# Patient Record
Sex: Male | Born: 1990 | Race: Black or African American | Hispanic: No | Marital: Single | Smoking: Current some day smoker
Health system: Southern US, Community
[De-identification: ages and names within clinical notes are randomized; demographics above are authoritative.]

---

## 2013-12-16 ENCOUNTER — Ambulatory Visit: Payer: Self-pay | Admitting: Family Medicine

## 2013-12-17 ENCOUNTER — Ambulatory Visit: Payer: Self-pay | Admitting: Family Medicine

## 2013-12-23 ENCOUNTER — Ambulatory Visit (INDEPENDENT_AMBULATORY_CARE_PROVIDER_SITE_OTHER): Payer: Self-pay | Admitting: Family Medicine

## 2013-12-23 ENCOUNTER — Encounter: Payer: Self-pay | Admitting: Family Medicine

## 2013-12-23 VITALS — BP 108/68 | HR 72 | Temp 98.6°F | Ht 72.0 in | Wt 197.4 lb

## 2013-12-23 DIAGNOSIS — IMO0002 Reserved for concepts with insufficient information to code with codable children: Secondary | ICD-10-CM

## 2013-12-23 DIAGNOSIS — R2232 Localized swelling, mass and lump, left upper limb: Secondary | ICD-10-CM

## 2013-12-23 DIAGNOSIS — R229 Localized swelling, mass and lump, unspecified: Secondary | ICD-10-CM

## 2013-12-23 DIAGNOSIS — T148XXA Other injury of unspecified body region, initial encounter: Secondary | ICD-10-CM

## 2013-12-23 DIAGNOSIS — Z23 Encounter for immunization: Secondary | ICD-10-CM

## 2013-12-23 NOTE — Progress Notes (Signed)
Pre visit review using our clinic review tool, if applicable. No additional management support is needed unless otherwise documented below in the visit note. 

## 2013-12-23 NOTE — Progress Notes (Signed)
Patient ID: Eric Yoder, male   DOB: October 18, 1991, 23 y.o.   MRN: [id number]   Subjective:    Patient ID: Eric Yoder, male    DOB: October 18, 1991, 23 y.o.   MRN: [id number] HPI Pt here to establish and c/o mass on L ring finger x2 months.  Pt had a cut about 2 months ago and mass started to develop.       History reviewed. No pertinent past medical history. No current outpatient prescriptions on file prior to visit.   No current facility-administered medications on file prior to visit.   Family History  Problem Relation Age of Onset  . Diabetes Mother   . Lung cancer Maternal Uncle     Smoker        Objective:    BP 108/68  Pulse 72  Temp(Src) 98.6 F (37 C) (Oral)  Ht 6' (1.829 m)  Wt 197 lb 6.4 oz (89.54 kg)  BMI 26.77 kg/m2  SpO2 98% General appearance: alert, cooperative, appears stated age and no distress Extremities: mass on L ring finger       Assessment & Plan:  1. Mass of finger of left hand Refer to ortho for evaluation - Ambulatory referral to Orthopedic Surgery

## 2013-12-23 NOTE — Patient Instructions (Signed)
Tetanus and Diphtheria Vaccine Your caregiver has suggested that you receive an immunization to prevent tetanus (lockjaw) and diphtheria. Tetanus and diphtheria are serious and deadly infectious diseases of the past that have been nearly wiped out by modern immunizations. Td or DT vaccines (shots) are the immunizations given to help prevent these illnesses. Td is the medical term for a standard tetanus dose, small diphtheria dose. DT means both in standard doses. ABOUT THE DISEASES Tetanus (lockjaw) and diphtheria are serious diseases. Tetanus is caused by a germ that lives in the soil. It enters the body through a cut or wound, often caused by a nail or broken piece of glass. You cannot catch tetanus from another person. Diphtheria spreads when germs pass from an infected person to the nose or throat of others. Tetanus causes serious, painful spasms of all muscles. It can lead to:  "Locking" of the muscles of the jaw and throat, so the patient cannot open his or her mouth or swallow.  Damage to the heart muscle. Diphtheria causes a thick coating in the nose, throat, or airway. It can lead to:  Breathing problems.  Kidney problems.  Heart failure.  Paralysis.  Death. ABOUT THE VACCINES  A vaccine is a shot (immunization) that can help prevent a disease. Vaccines have helped lower the rates of getting certain diseases. If people stopped getting vaccinated, more people would develop illnesses. These vaccines can be used in three ways:  As catch-up for people who did not get all their doses when they were children.  As a booster dose every 10 years.  For protection against tetanus infection, after a wound. Benefits of the vaccines Vaccination is the best way to protect against tetanus and diphtheria. Because of vaccination, there are fewer cases of these diseases. Cases are rare in children because most get a routine vaccination with DTP (Diphtheria, Tetanus, and Pertussis), DTaP  (Diphtheria, Tetanus, and acellular Pertussis), or DT (Diphtheria and Tetanus) vaccines. There would be many more cases if we stopped vaccinating people. Tetanus kills about 1 in 5 people who are infected. WHEN SHOULD YOU GET TD VACCINE?  Td is made for people 7 years of age and older.  People who have not gotten at least 3 doses of any tetanus and diphtheria vaccine (DTP, DTaP or DT) during their lifetime should do so using Td. After a person gets the third dose, a Td dose is needed every 10 years all through life. This is because protection fades over time. Booster shots are needed every 10 years.  Other vaccines may be given at the same time as Td. You may not know today whether your immunizations are current. The vaccine given today is to protect you from your next cut or injury. It does not offer protection for the current injury. An immune globulin injection may be given, if protection is needed immediately. Check with your caregiver later regarding your immunization status. Tell your caregiver if the person getting the vaccine:  Has ever had a serious allergic reaction or other problem with Td, or any other tetanus and diphtheria vaccine (DTP, DTaP, or DT). People who have had a serious allergic reaction should not receive the vaccine.  Has epilepsy or another nervous system illness.  Has had Guillain Barre Syndrome (GBS) in the past.  Now has a moderate or severe illness.  Is pregnant.  If you are not sure, ask your caregiver. WHAT ARE THE RISKS FROM TD VACCINE?  As with any medicine, there are very small   risks that serious problems, even death, could occur after getting a vaccine. However, the risk of a serious side effect from the vaccine is almost zero.  The risks from the vaccine are much smaller than the risks from the diseases, if people stopped getting vaccinated. Both diseases can cause serious health problems, which are prevented by the vaccine.  Almost all people who get  Td have no problems from it. Mild problems If mild problems occur, they usually start within hours to a day or two after vaccination. They may last 1-2 days:  Soreness, redness, or swelling where the shot was given.  Headache or tiredness.  Occasionally, a low grade fever. These problems can be worse in adults who get Td vaccine very often. Non-aspirin medicines may be used to reduce soreness. Severe problems These problems happen very rarely:  Serious allergic reaction (at most, occurs in 1 in 1 million vaccinated persons). This occurs almost immediately, and is treatable with medicines. Signs of a serious allergic reaction include:  Difficulty breathing.  Hoarseness or wheezing.  Hives.  Dizziness.  Deep, aching pain and muscle wasting in upper arm(s). Overall, the benefits to you and your family from these vaccines are far greater than the risk. WHAT TO DO IF THERE IS A SERIOUS REACTION:  Call a caregiver or get the person to a doctor or emergency room right away.  Write down what happened, the date and time it happened, and tell your caregiver.  Ask your caregiver to file a Vaccine Adverse Event Report form or call, toll-free: [phone] If you want to learn more about this vaccine, ask your caregiver. She/he can give you the vaccine package insert or suggest other sources of information. Also, the National Vaccine Injury Compensation Program gives compensation (payment) for persons thought to be injured by vaccines. For details call, toll-free: (800) 338-2382. Document Released: 10/14/2000 Document Revised: 01/09/2012 Document Reviewed: 09/03/2009 [employer] Patient Information 2014 [employer].  

## 2013-12-24 ENCOUNTER — Telehealth: Payer: Self-pay | Admitting: Family Medicine

## 2013-12-24 NOTE — Telephone Encounter (Signed)
Relevant patient education assigned to patient using Emmi. ° °

## 2014-02-06 ENCOUNTER — Telehealth: Payer: Self-pay

## 2014-02-06 NOTE — Telephone Encounter (Signed)
Left message for call back Non-identifiable   NEW PATIENT 

## 2014-02-07 ENCOUNTER — Ambulatory Visit: Payer: Self-pay | Admitting: Family Medicine

## 2014-02-07 DIAGNOSIS — Z0289 Encounter for other administrative examinations: Secondary | ICD-10-CM

## 2014-02-12 NOTE — Telephone Encounter (Signed)
No showed

## 2018-10-09 ENCOUNTER — Other Ambulatory Visit: Payer: Self-pay

## 2018-10-09 ENCOUNTER — Encounter (HOSPITAL_COMMUNITY): Payer: Self-pay

## 2018-10-09 ENCOUNTER — Emergency Department (HOSPITAL_COMMUNITY)
Admission: EM | Admit: 2018-10-09 | Discharge: 2018-10-09 | Disposition: A | Discharge status: Home / Self Care | Payer: Self-pay | Attending: Emergency Medicine | Admitting: Emergency Medicine

## 2018-10-09 ENCOUNTER — Emergency Department (HOSPITAL_COMMUNITY): Payer: Self-pay

## 2018-10-09 DIAGNOSIS — F17210 Nicotine dependence, cigarettes, uncomplicated: Secondary | ICD-10-CM | POA: Insufficient documentation

## 2018-10-09 DIAGNOSIS — J111 Influenza due to unidentified influenza virus with other respiratory manifestations: Secondary | ICD-10-CM | POA: Insufficient documentation

## 2018-10-09 DIAGNOSIS — R6889 Other general symptoms and signs: Secondary | ICD-10-CM

## 2018-10-09 DIAGNOSIS — Z79899 Other long term (current) drug therapy: Secondary | ICD-10-CM | POA: Insufficient documentation

## 2018-10-09 LAB — CBC WITH DIFFERENTIAL/PLATELET
Abs Immature Granulocytes: 0.02 10*3/uL (ref 0.00–0.07)
Basophils Absolute: 0 10*3/uL (ref 0.0–0.1)
Basophils Relative: 0 %
Eosinophils Absolute: 0 10*3/uL (ref 0.0–0.5)
Eosinophils Relative: 0 %
HCT: 46 % (ref 39.0–52.0)
Hemoglobin: 14 g/dL (ref 13.0–17.0)
Immature Granulocytes: 1 %
Lymphocytes Relative: 25 %
Lymphs Abs: 0.9 10*3/uL (ref 0.7–4.0)
MCH: 22.1 pg — ABNORMAL LOW (ref 26.0–34.0)
MCHC: 30.4 g/dL (ref 30.0–36.0)
MCV: 72.6 fL — ABNORMAL LOW (ref 80.0–100.0)
Monocytes Absolute: 0.4 10*3/uL (ref 0.1–1.0)
Monocytes Relative: 10 %
Neutro Abs: 2.4 10*3/uL (ref 1.7–7.7)
Neutrophils Relative %: 64 %
Platelets: 195 10*3/uL (ref 150–400)
RBC: 6.34 MIL/uL — ABNORMAL HIGH (ref 4.22–5.81)
RDW: 14.8 % (ref 11.5–15.5)
WBC: 3.7 10*3/uL — ABNORMAL LOW (ref 4.0–10.5)
nRBC: 0 % (ref 0.0–0.2)

## 2018-10-09 LAB — COMPREHENSIVE METABOLIC PANEL
ALT: 25 U/L (ref 0–44)
AST: 27 U/L (ref 15–41)
Albumin: 3.8 g/dL (ref 3.5–5.0)
Alkaline Phosphatase: 48 U/L (ref 38–126)
Anion gap: 9 (ref 5–15)
BUN: 10 mg/dL (ref 6–20)
CO2: 26 mmol/L (ref 22–32)
Calcium: 9 mg/dL (ref 8.9–10.3)
Chloride: 100 mmol/L (ref 98–111)
Creatinine, Ser: 1.21 mg/dL (ref 0.61–1.24)
GFR calc Af Amer: >60 mL/min (ref >60)
GFR calc non Af Amer: >60 mL/min (ref >60)
Glucose, Bld: 119 mg/dL — ABNORMAL HIGH (ref 70–99)
Potassium: 3.9 mmol/L (ref 3.5–5.1)
Sodium: 135 mmol/L (ref 135–145)
Total Bilirubin: 0.5 mg/dL (ref 0.3–1.2)
Total Protein: 7 g/dL (ref 6.5–8.1)

## 2018-10-09 MED ORDER — ACETAMINOPHEN 325 MG PO TABS
650.0000 mg | ORAL_TABLET | Freq: Once | ORAL | Status: AC | PRN
  Administered 2018-10-09: 650 mg via ORAL
  Filled 2018-10-09: qty 2

## 2018-10-09 MED ORDER — ONDANSETRON HCL 4 MG/2ML IJ SOLN
4.0000 mg | Freq: Once | INTRAMUSCULAR | Status: AC
  Administered 2018-10-09: 4 mg via INTRAVENOUS
  Filled 2018-10-09: qty 2

## 2018-10-09 MED ORDER — ONDANSETRON HCL 4 MG PO TABS: 4.0000 mg | ORAL_TABLET | Freq: Four times a day (QID) | ORAL | 0 refills | Status: AC

## 2018-10-09 MED ORDER — SODIUM CHLORIDE 0.9 % IV BOLUS
1000.0000 mL | Freq: Once | INTRAVENOUS | Status: AC
Start: 2018-10-09 — End: 2018-10-09
  Administered 2018-10-09: 1000 mL via INTRAVENOUS

## 2018-10-09 NOTE — ED Provider Notes (Signed)
[facility] EMERGENCY DEPARTMENT Provider Note   CSN: [id number] Arrival date & time: 10/09/18  1024   History   Chief Complaint Chief Complaint  Patient presents with  . Emesis    HPI Eric Yoder is a 27 y.o. male.  HPI    27 year old male presents today with complaints of affection.  Patient notes that family members have been sick with the flu recently.  He notes 3 days ago he developed rhinorrhea dry nonproductive cough body aches and fever.  Patient notes several episodes of vomiting after coughing.  He denies any associated abdominal pain rashes.  He denies any chronic health conditions reports that he does smoke cigarettes.  He notes he has been unable to tolerate p.o. over the last several days.    History reviewed. No pertinent past medical history.  There are no active problems to display for this patient.   History reviewed. No pertinent surgical history.      Home Medications    Prior to Admission medications   Medication Sig Start Date End Date Taking? Authorizing Provider  ondansetron (ZOFRAN) 4 MG tablet Take 1 tablet (4 mg total) by mouth every 6 (six) hours. 10/09/18   Eyvonne Mechanic, PA-C    Family History Family History  Problem Relation Age of Onset  . Diabetes Mother   . Lung cancer Maternal Uncle        Smoker    Social History Social History   Tobacco Use  . Smoking status: Current Some Day Smoker    Packs/day: 0.30    Years: 2.00    Pack years: 0.60    Types: Cigars, Cigarettes  . Smokeless tobacco: Never Used  Substance Use Topics  . Alcohol use: Yes    Comment: Occ  . Drug use: No     Allergies   Patient has no known allergies.   Review of Systems Review of Systems  All other systems reviewed and are negative.   Physical Exam Updated Vital Signs BP (!) 141/82   Pulse 61   Temp 98.9 F (37.2 C) (Oral)   Resp 18   SpO2 100%   Physical Exam  Constitutional: He is oriented to person,  place, and time. He appears well-developed and well-nourished.  HENT:  Head: Normocephalic and atraumatic.  Oropharynx clear no swelling or edema  Eyes: Pupils are equal, round, and reactive to light. Conjunctivae are normal. Right eye exhibits no discharge. Left eye exhibits no discharge. No scleral icterus.  Neck: Normal range of motion. No JVD present. No tracheal deviation present.  Cardiovascular: Regular rhythm, normal heart sounds and intact distal pulses.  Pulmonary/Chest: Effort normal. No stridor.  Abdominal: Soft. He exhibits no distension and no mass. There is no tenderness. There is no rebound and no guarding. No hernia.  Neurological: He is alert and oriented to person, place, and time. Coordination normal.  Psychiatric: He has a normal mood and affect. His behavior is normal. Judgment and thought content normal.  Nursing note and vitals reviewed.    ED Treatments / Results  Labs (all labs ordered are listed, but only abnormal results are displayed) Labs Reviewed  CBC WITH DIFFERENTIAL/PLATELET - Abnormal; Notable for the following components:      Result Value   WBC 3.7 (*)    RBC 6.34 (*)    MCV 72.6 (*)    MCH 22.1 (*)    All other components within normal limits  COMPREHENSIVE METABOLIC PANEL - Abnormal; Notable for the following  components:   Glucose, Bld 119 (*)    All other components within normal limits    EKG None  Radiology Dg Chest 2 View  Result Date: 10/09/2018 CLINICAL DATA:  Recent onset of cough and fever. History of smoking. EXAM: CHEST - 2 VIEW COMPARISON:  None. FINDINGS: The cardiomediastinal silhouette is within normal limits. The lungs are well inflated and clear. There is no evidence of pleural effusion or pneumothorax. No acute osseous abnormality is identified. IMPRESSION: No active cardiopulmonary disease. Electronically Signed   By: Sebastian AcheAllen  Grady M.D.   On: 10/09/2018 12:53    Procedures Procedures (including critical care  time)  Medications Ordered in ED Medications  acetaminophen (TYLENOL) tablet 650 mg (650 mg Oral Given 10/09/18 1052)  sodium chloride 0.9 % bolus 1,000 mL (0 mLs Intravenous Stopped 10/09/18 1412)  ondansetron (ZOFRAN) injection 4 mg (4 mg Intravenous Given 10/09/18 1215)     Initial Impression / Assessment and Plan / ED Course  I have reviewed the triage vital signs and the nursing notes.  Pertinent labs & imaging results that were available during my care of the patient were reviewed by me and considered in my medical decision making (see chart for details).     27 year old male presents today with likely viral illness question influenza.  Patient with reassuring laboratory analysis, no significant chronic health conditions.  Patient stable for outpatient management with strict return precautions.  He verbalized understanding and agreement to today's plan had no further questions or concerns.  Final Clinical Impressions(s) / ED Diagnoses   Final diagnoses:  Flu-like symptoms    ED Discharge Orders         Ordered    ondansetron (ZOFRAN) 4 MG tablet  Every 6 hours     10/09/18 1412           Eyvonne MechanicHedges, Anaiya Wisinski, PA-C 10/09/18 1549    Mancel BaleWentz, Elliott, MD 10/10/18 2111

## 2018-10-09 NOTE — Discharge Instructions (Addendum)
Please read attached information. If you experience any new or worsening signs or symptoms please return to the emergency room for evaluation. Please follow-up with your primary care provider or specialist as discussed. Please use medication prescribed only as directed and discontinue taking if you have any concerning signs or symptoms.   °

## 2018-10-09 NOTE — ED Notes (Signed)
Patient transported to X-ray 

## 2018-10-09 NOTE — ED Notes (Signed)
Pt verbalized understanding of discharge paperwork, prescriptions and follow-up care 

## 2018-10-09 NOTE — ED Triage Notes (Signed)
Pt states for several days he has had flu like symptoms. He reports last 2 days he has not been able to keep anything by mouth down. He reports taking cough syrup and tylenol OTC for pain.

## 2018-12-24 ENCOUNTER — Other Ambulatory Visit: Payer: Self-pay

## 2018-12-24 ENCOUNTER — Emergency Department (HOSPITAL_BASED_OUTPATIENT_CLINIC_OR_DEPARTMENT_OTHER)
Admission: EM | Admit: 2018-12-24 | Discharge: 2018-12-24 | Disposition: A | Discharge status: Home / Self Care | Payer: BLUE CROSS/BLUE SHIELD | Attending: Emergency Medicine | Admitting: Emergency Medicine

## 2018-12-24 ENCOUNTER — Encounter (HOSPITAL_BASED_OUTPATIENT_CLINIC_OR_DEPARTMENT_OTHER): Payer: Self-pay | Admitting: *Deleted

## 2018-12-24 DIAGNOSIS — W260XXA Contact with knife, initial encounter: Secondary | ICD-10-CM | POA: Diagnosis not present

## 2018-12-24 DIAGNOSIS — S61012A Laceration without foreign body of left thumb without damage to nail, initial encounter: Secondary | ICD-10-CM | POA: Insufficient documentation

## 2018-12-24 DIAGNOSIS — Y990 Civilian activity done for income or pay: Secondary | ICD-10-CM | POA: Diagnosis not present

## 2018-12-24 DIAGNOSIS — F17210 Nicotine dependence, cigarettes, uncomplicated: Secondary | ICD-10-CM | POA: Diagnosis not present

## 2018-12-24 DIAGNOSIS — Z79899 Other long term (current) drug therapy: Secondary | ICD-10-CM | POA: Diagnosis not present

## 2018-12-24 DIAGNOSIS — Y929 Unspecified place or not applicable: Secondary | ICD-10-CM | POA: Diagnosis not present

## 2018-12-24 DIAGNOSIS — S6992XA Unspecified injury of left wrist, hand and finger(s), initial encounter: Secondary | ICD-10-CM | POA: Diagnosis present

## 2018-12-24 DIAGNOSIS — Y939 Activity, unspecified: Secondary | ICD-10-CM | POA: Diagnosis not present

## 2018-12-24 MED ORDER — LIDOCAINE HCL 2 % IJ SOLN
10.0000 mL | Freq: Once | INTRAMUSCULAR | Status: AC
  Administered 2018-12-24: 200 mg via INTRADERMAL
  Filled 2018-12-24: qty 20

## 2018-12-24 NOTE — ED Notes (Signed)
Approximately 1 inch laceration to base of left thumb that occurred at 1100. Pt is able to move and bend his thumb without issue and tetanus is up to date

## 2018-12-24 NOTE — Discharge Instructions (Signed)
Thank you for allowing me to care for you today. Please return to the emergency department if you have new or worsening symptoms. Take your medications as instructed.  ° °

## 2018-12-24 NOTE — ED Triage Notes (Signed)
Laceration to his left thumb with a knife.

## 2018-12-24 NOTE — ED Provider Notes (Signed)
[facility] EMERGENCY DEPARTMENT Provider Note   CSN: [id number] Arrival date & time: 12/24/18  1605    History   Chief Complaint Chief Complaint  Patient presents with  . Laceration    HPI Eric Yoder is a 29 y.o. male.     Patient is a 28 year old African-American male with no past medical history presents emergency department for laceration to his left thumb.  He reports that just prior to arrival he was at work at an [employer] place when he was using a knife to cut on lip flap on a vehicle.  Reports that the knife slipped and cut the edge of his left thumb.  Denies any excessive bleeding, numbness, tingling.  Still able to move the thumb in all directions.  Denies any lightheadedness dizziness or any other injuries.     History reviewed. No pertinent past medical history.  There are no active problems to display for this patient.   History reviewed. No pertinent surgical history.      Home Medications    Prior to Admission medications   Medication Sig Start Date End Date Taking? Authorizing Provider  ondansetron (ZOFRAN) 4 MG tablet Take 1 tablet (4 mg total) by mouth every 6 (six) hours. 10/09/18   Eyvonne Mechanic, PA-C    Family History Family History  Problem Relation Age of Onset  . Diabetes Mother   . Lung cancer Maternal Uncle        Smoker    Social History Social History   Tobacco Use  . Smoking status: Current Some Day Smoker    Packs/day: 0.30    Years: 2.00    Pack years: 0.60    Types: Cigars, Cigarettes  . Smokeless tobacco: Never Used  Substance Use Topics  . Alcohol use: Yes    Comment: Occ  . Drug use: No     Allergies   Patient has no known allergies.   Review of Systems Review of Systems  Constitutional: Negative for fever.  Respiratory: Negative for shortness of breath.   Skin: Positive for wound.  Allergic/Immunologic: Negative for immunocompromised state.  Neurological: Negative for dizziness and  light-headedness.  Hematological: Negative for adenopathy. Does not bruise/bleed easily.     Physical Exam Updated Vital Signs BP 133/70   Pulse 67   Temp 98.1 F (36.7 C) (Oral)   Resp 20   Ht 6\' 1"  (1.854 m)   Wt 88.9 kg   SpO2 100%   BMI 25.86 kg/m   Physical Exam Vitals signs and nursing note reviewed.  Constitutional:      General: He is not in acute distress.    Appearance: Normal appearance. He is not ill-appearing, toxic-appearing or diaphoretic.  HENT:     Head: Normocephalic.  Eyes:     Conjunctiva/sclera: Conjunctivae normal.  Pulmonary:     Effort: Pulmonary effort is normal.  Musculoskeletal:     Left hand: He exhibits laceration. He exhibits normal range of motion, no tenderness, no bony tenderness, normal two-point discrimination, normal capillary refill and no swelling. Normal sensation noted.       Hands:  Skin:    General: Skin is warm and dry.     Capillary Refill: Capillary refill takes less than 2 seconds.  Neurological:     General: No focal deficit present.     Mental Status: He is alert.  Psychiatric:        Mood and Affect: Mood normal.      ED Treatments / Results  Labs (all labs ordered are listed, but only abnormal results are displayed) Labs Reviewed - No data to display  EKG None  Radiology No results found.  Procedures .Marland KitchenLaceration Repair Date/Time: 12/24/2018 6:49 PM Performed by: Arlyn Dunning, PA-C Authorized by: Arlyn Dunning, PA-C   Consent:    Consent obtained:  Verbal   Consent given by:  Patient   Risks discussed:  Infection, need for additional repair, pain, poor cosmetic result and poor wound healing   Alternatives discussed:  No treatment and delayed treatment Universal protocol:    Procedure explained and questions answered to patient or proxy's satisfaction: yes     Relevant documents present and verified: yes     Test results available and properly labeled: yes     Imaging studies available: yes      Required blood products, implants, devices, and special equipment available: yes     Site/side marked: yes     Immediately prior to procedure, a time out was called: yes     Patient identity confirmed:  Verbally with patient Anesthesia (see MAR for exact dosages):    Anesthesia method:  Local infiltration   Local anesthetic:  Lidocaine 2% w/o epi Laceration details:    Location:  Finger   Finger location:  L thumb   Length (cm):  3.1   Depth (mm):  4 Repair type:    Repair type:  Simple Pre-procedure details:    Preparation:  Patient was prepped and draped in usual sterile fashion Exploration:    Hemostasis achieved with:  Direct pressure   Wound exploration: wound explored through full range of motion     Contaminated: no   Treatment:    Area cleansed with:  Saline and soap and water   Amount of cleaning:  Standard Skin repair:    Repair method:  Sutures   Suture size:  4-0   Suture technique:  Simple interrupted   Number of sutures:  6 Approximation:    Approximation:  Close Post-procedure details:    Dressing:  Open (no dressing)   (including critical care time)  Medications Ordered in ED Medications  lidocaine (XYLOCAINE) 2 % (with pres) injection 200 mg (200 mg Intradermal Given 12/24/18 1825)     Initial Impression / Assessment and Plan / ED Course  I have reviewed the triage vital signs and the nursing notes.  Pertinent labs & imaging results that were available during my care of the patient were reviewed by me and considered in my medical decision making (see chart for details).        Based on review of vitals, medical screening exam, lab work and/or imaging, there does not appear to be an acute, emergent etiology for the patient's symptoms. Counseled pt on good return precautions and encouraged both PCP and ED follow-up as needed.   Clinical Impression: 1. Laceration of left thumb without foreign body without damage to nail, initial encounter      Disposition: Discharge    This note was prepared with assistance of Dragon voice recognition software. Occasional wrong-word or sound-a-like substitutions may have occurred due to the inherent limitations of voice recognition software.   Final Clinical Impressions(s) / ED Diagnoses   Final diagnoses:  Laceration of left thumb without foreign body without damage to nail, initial encounter    ED Discharge Orders    None       Jeral Pinch 12/24/18 2302    Maia Plan, MD 12/25/18 1017

## 2020-03-04 IMAGING — CR DG CHEST 2V
2 series · 2 of 2 positions shown · non-contrast
Comparison: None.

CLINICAL DATA: Recent onset of cough and fever. History of smoking.

EXAM:
CHEST - 2 VIEW

[chest pa]
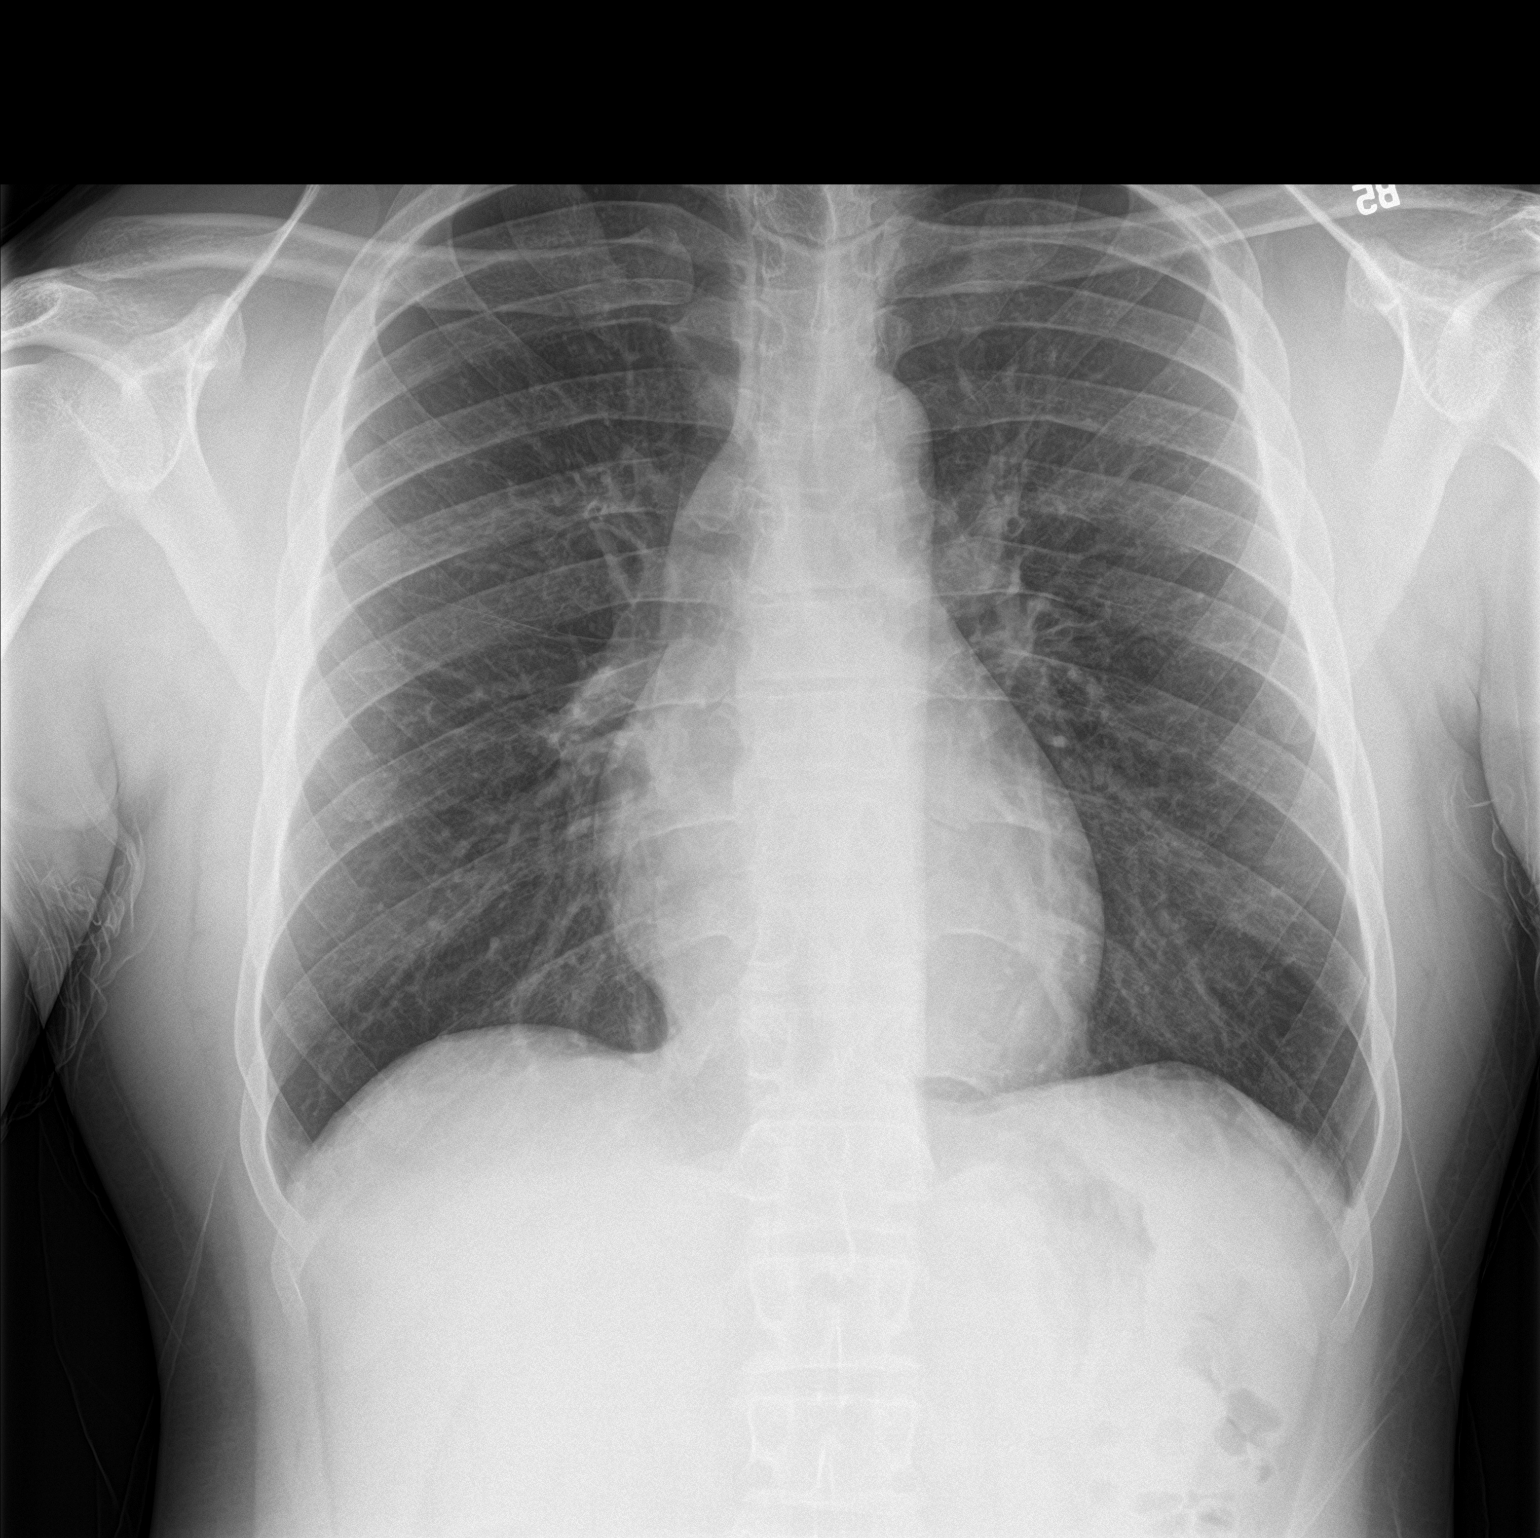

[chest lat]
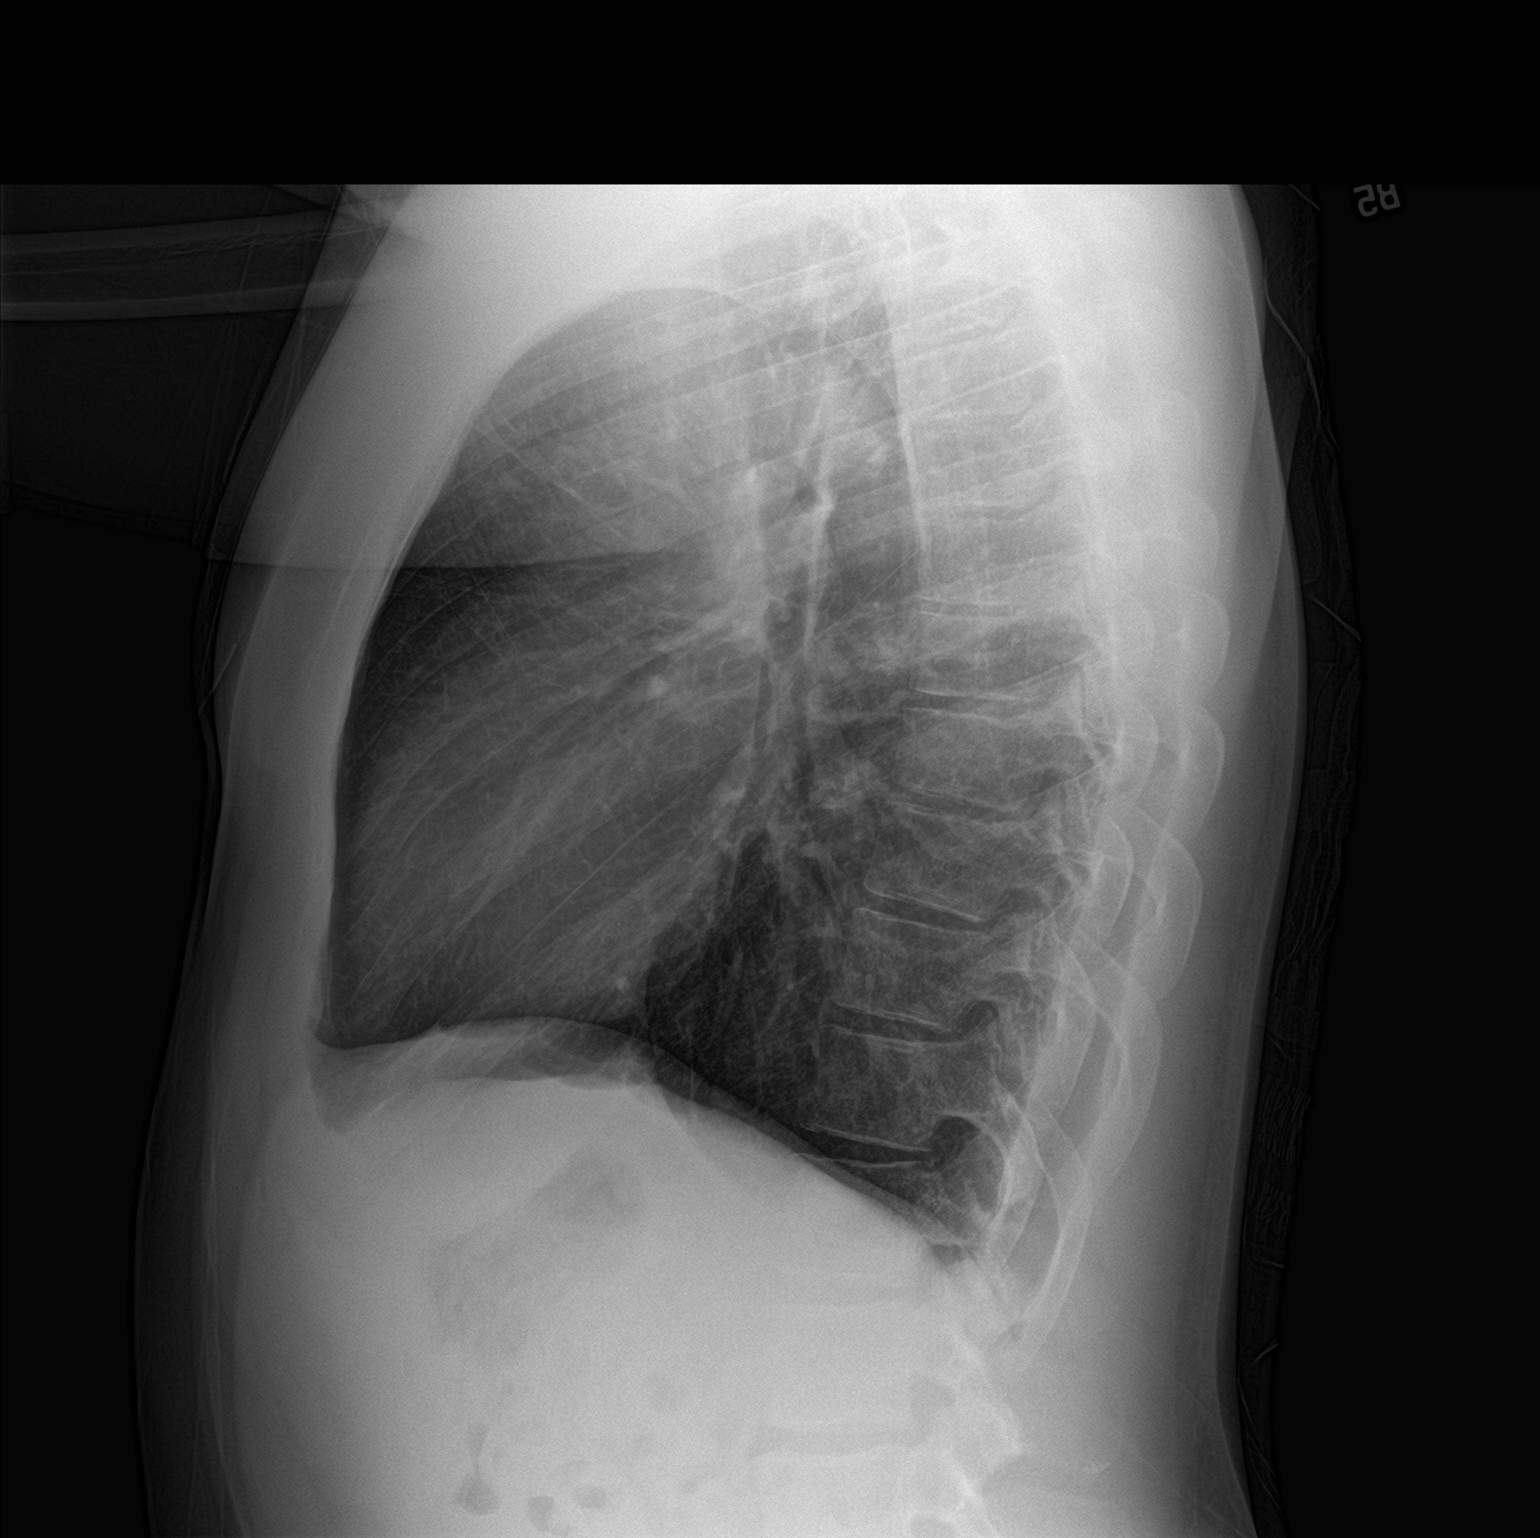

[2 of 2 positions shown; findings below may reference images not displayed]

FINDINGS: The cardiomediastinal silhouette is within normal limits. The lungs
are well inflated and clear. There is no evidence of pleural
effusion or pneumothorax. No acute osseous abnormality is
identified.
IMPRESSION: No active cardiopulmonary disease.
